# Patient Record
Sex: Male | Born: 1975 | Race: Black or African American | Hispanic: No | Marital: Married | Smoking: Current every day smoker
Health system: Southern US, Community
[De-identification: ages and names within clinical notes are randomized; demographics above are authoritative.]

## PROBLEM LIST (undated history)

## (undated) DIAGNOSIS — J189 Pneumonia, unspecified organism: Secondary | ICD-10-CM

## (undated) DIAGNOSIS — I10 Essential (primary) hypertension: Secondary | ICD-10-CM

---

## 2016-12-30 ENCOUNTER — Emergency Department: Payer: Self-pay

## 2016-12-30 ENCOUNTER — Emergency Department
Admission: EM | Admit: 2016-12-30 | Discharge: 2016-12-30 | Disposition: A | Discharge status: Home / Self Care | Payer: Self-pay | Attending: Emergency Medicine | Admitting: Emergency Medicine

## 2016-12-30 ENCOUNTER — Encounter: Payer: Self-pay | Admitting: *Deleted

## 2016-12-30 DIAGNOSIS — J189 Pneumonia, unspecified organism: Secondary | ICD-10-CM

## 2016-12-30 DIAGNOSIS — J181 Lobar pneumonia, unspecified organism: Secondary | ICD-10-CM | POA: Insufficient documentation

## 2016-12-30 DIAGNOSIS — F17200 Nicotine dependence, unspecified, uncomplicated: Secondary | ICD-10-CM

## 2016-12-30 DIAGNOSIS — R030 Elevated blood-pressure reading, without diagnosis of hypertension: Secondary | ICD-10-CM

## 2016-12-30 LAB — URINALYSIS, COMPLETE (UACMP) WITH MICROSCOPIC
BACTERIA UA: NONE SEEN
BILIRUBIN URINE: NEGATIVE
GLUCOSE, UA: NEGATIVE mg/dL
HGB URINE DIPSTICK: NEGATIVE
Ketones, ur: NEGATIVE mg/dL
LEUKOCYTES UA: NEGATIVE
Nitrite: NEGATIVE
PROTEIN: NEGATIVE mg/dL
Specific Gravity, Urine: 1.029 (ref 1.005–1.030)
pH: 5 (ref 5.0–8.0)

## 2016-12-30 MED ORDER — AZITHROMYCIN 250 MG PO TABS: ORAL_TABLET | ORAL | 0 refills | Status: DC

## 2016-12-30 MED ORDER — HYDROCODONE-ACETAMINOPHEN 5-325 MG PO TABS: 1.0000 | ORAL_TABLET | ORAL | 0 refills | Status: DC | PRN

## 2016-12-30 NOTE — ED Provider Notes (Signed)
[facility] Emergency Department Provider Note  ____________________________________________   First MD Initiated Contact with Patient 12/30/16 1158     (approximate)  I have reviewed the triage vital signs and the nursing notes.   HISTORY  Chief Complaint Fever and Cough    HPI Tyler Oliver is a 41 y.o. male is here with complaint of fever, body aches, cough and congestion for 6 days. Patient states that he had low-grade temp with some chills. He also complains of  low back pain. 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 He denies any injury to his back. He is unaware of any urinary symptoms or history of kidney stones. He rates his pain as 5/10.   History reviewed. No pertinent past medical history.  There are no active problems to display for this patient.   History reviewed. No pertinent surgical history.  Prior to Admission medications   Medication Sig Start Date End Date Taking? Authorizing Provider  azithromycin (ZITHROMAX Z-PAK) 250 MG tablet Take 2 tablets (500 mg) on  Day 1,  followed by 1 tablet (250 mg) once daily on Days 2 through 5. 12/30/16   Tommi Rumps, PA-C  HYDROcodone-acetaminophen (NORCO/VICODIN) 5-325 MG tablet Take 1 tablet by mouth every 4 (four) hours as needed for moderate pain. 12/30/16   Tommi Rumps, PA-C    Allergies Patient has no known allergies.  History reviewed. No pertinent family history.  Social History Social History  Substance Use Topics  . Smoking status: Never Smoker  . Smokeless tobacco: Never Used  . Alcohol use No    Review of Systems Constitutional: Subjective fever/chills Eyes: No visual changes. ENT: No sore throat. Positive nasal congestion. Cardiovascular: Denies chest pain. Respiratory: Denies shortness of breath. Positive productive cough. Gastrointestinal: No abdominal pain.  No nausea, no vomiting.     Genitourinary: Negative for dysuria. Musculoskeletal: Positive for back pain. Positive body aches. Skin: Negative for rash. Neurological: Negative for headaches, focal weakness or numbness.  10-point ROS otherwise negative.  ____________________________________________   PHYSICAL EXAM:  VITAL SIGNS: ED Triage Vitals  Enc Vitals Group     BP 12/30/16 1013 (!) 156/99     Pulse Rate 12/30/16 1013 83     Resp 12/30/16 1013 18     Temp 12/30/16 1013 99.8 F (37.7 C)     Temp Source 12/30/16 1013 Oral     SpO2 12/30/16 1013 98 %     Weight 12/30/16 1014 195 lb (88.5 kg)     Height 12/30/16 1014 6\' 1"  (1.854 m)     Head Circumference --      Peak Flow --      Pain Score 12/30/16 1013 5     Pain Loc --      Pain Edu? --      Excl. in GC? --     Constitutional: Alert and oriented. Well appearing and in no acute distress. Eyes: Conjunctivae are normal. PERRL. EOMI. Head: Atraumatic. Nose: Positive congestion/rhinnorhea. Mouth/Throat: Mucous membranes are moist.  Oropharynx non-erythematous. Posterior drainage noted. Neck: No stridor.   Hematological/Lymphatic/Immunilogical: No cervical lymphadenopathy. Cardiovascular: Normal rate, regular rhythm. Grossly normal heart sounds.  Good peripheral circulation. Respiratory: Normal respiratory effort.  No retractions. Lungs CTAB. No wheeze or rhonchi was noted. Patient does have a congested cough occasionally. Gastrointestinal: Soft and nontender. No distention.  Musculoskeletal: Moves upper and lower extremities without any difficulty.  Normal gait was noted. Neurologic:  Normal speech and language. No gross focal neurologic deficits are appreciated. No gait instability. Skin:  Skin is warm, dry and intact. No rash noted. Psychiatric: Mood and affect are normal. Speech and behavior are normal.  ____________________________________________   LABS (all labs ordered are listed, but only abnormal results are displayed)  Labs Reviewed   URINALYSIS, COMPLETE (UACMP) WITH MICROSCOPIC - Abnormal; Notable for the following:       Result Value   Color, Urine YELLOW (*)    APPearance CLEAR (*)    Squamous Epithelial / LPF 0-5 (*)    All other components within normal limits    RADIOLOGY  Chest x-ray per radiologist: IMPRESSION:  Peripheral right upper lobe opacity suspicious for Pneumonia in this  setting. No pleural effusion or other acute cardiopulmonary  abnormality.  I, Tommi Rumpshonda L Yliana Gravois, personally viewed and evaluated these images (plain radiographs) as part of my medical decision making, as well as reviewing the written report by the radiologist. ____________________________________________   PROCEDURES  Procedure(s) performed: None  Procedures  Critical Care performed: No  ____________________________________________   INITIAL IMPRESSION / ASSESSMENT AND PLAN / ED COURSE  Pertinent labs & imaging results that were available during my care of the patient were reviewed by me and considered in my medical decision making (see chart for details).  Patient was made aware that he does have pneumonia on his chest x-ray. Patient was given a prescription for Zithromax and Norco as needed for pain. Patient is to follow-up for a recheck of his pneumonia and repeat chest x-ray in 3  weeks as suggested by the radiologist. Patient was encouraged to discontinue smoking. He is given a prescription for Norco as needed for back pain and also to help decrease the amount of coughing. 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 He is given a list of clinics to follow up with his he does not have a PCP. His wife also is working on getting him an appointment with [facility]. He also [facility].  Patient also continued to have elevated blood pressure while in the department and will follow up with this on his appointment for his pneumonia.       ____________________________________________   FINAL CLINICAL IMPRESSION(S) / ED DIAGNOSES  Final diagnoses:  Community acquired pneumonia of right upper lobe of lung (HCC)  Current every day smoker  Elevated blood-pressure reading without diagnosis of hypertension      NEW MEDICATIONS STARTED DURING THIS VISIT:  Discharge Medication List as of 12/30/2016  1:10 PM    START taking these medications   Details  azithromycin (ZITHROMAX Z-PAK) 250 MG tablet Take 2 tablets (500 mg) on  Day 1,  followed by 1 tablet (250 mg) once daily on Days 2 through 5., Print    HYDROcodone-acetaminophen (NORCO/VICODIN) 5-325 MG tablet Take 1 tablet by mouth every 4 (four) hours as needed for moderate pain., Starting Tue 12/30/2016, Print         Note:  This document was prepared using Dragon voice recognition software and may include unintentional dictation errors.    Tommi Rumpshonda L Shawnee Gambone, PA-C 12/30/16 1500    Emily FilbertJonathan E Williams, MD 12/30/16 [phone]

## 2016-12-30 NOTE — Discharge Instructions (Signed)
Follow-up with one of the clinics listed above. You will need to be reevaluated in approximately 3 weeks for your pneumonia. Call to make an appointment. [facility] available as well as a possibility [facility]. You may also call [facility] to see if they're taking new patients. You will need to be followed up for your pneumonia and lower blood pressure will also need to be rechecked. Your blood pressure was elevated today in the emergency room and 153/99. Discontinue smoking. Begin taking Zithromax for the next 5 days. This medication stays in her body for 10 days. Norco as needed for pain and also this will decrease your coughing. Increase fluids. Return to the emergency room if any severe worsening of your symptoms.

## 2016-12-30 NOTE — ED Triage Notes (Signed)
States fever, body aches, cough and congestion since Wednesday

## 2016-12-30 NOTE — ED Notes (Signed)
Pt ambulatory to xray.

## 2016-12-30 NOTE — ED Notes (Signed)
See triage note  Low grade fever on arrival   Developed fever with body aches last weds

## 2017-01-13 ENCOUNTER — Encounter: Payer: Self-pay | Admitting: Emergency Medicine

## 2017-01-13 ENCOUNTER — Emergency Department
Admission: EM | Admit: 2017-01-13 | Discharge: 2017-01-13 | Disposition: A | Discharge status: Home / Self Care | Payer: Self-pay | Attending: Emergency Medicine | Admitting: Emergency Medicine

## 2017-01-13 DIAGNOSIS — X58XXXA Exposure to other specified factors, initial encounter: Secondary | ICD-10-CM | POA: Insufficient documentation

## 2017-01-13 DIAGNOSIS — S39012A Strain of muscle, fascia and tendon of lower back, initial encounter: Secondary | ICD-10-CM | POA: Insufficient documentation

## 2017-01-13 DIAGNOSIS — Y999 Unspecified external cause status: Secondary | ICD-10-CM | POA: Insufficient documentation

## 2017-01-13 DIAGNOSIS — Y939 Activity, unspecified: Secondary | ICD-10-CM | POA: Insufficient documentation

## 2017-01-13 DIAGNOSIS — Y929 Unspecified place or not applicable: Secondary | ICD-10-CM | POA: Insufficient documentation

## 2017-01-13 HISTORY — DX: Pneumonia, unspecified organism: J18.9

## 2017-01-13 LAB — URINALYSIS, COMPLETE (UACMP) WITH MICROSCOPIC
Bacteria, UA: NONE SEEN
Bilirubin Urine: NEGATIVE
Glucose, UA: NEGATIVE mg/dL
Hgb urine dipstick: NEGATIVE
Ketones, ur: NEGATIVE mg/dL
Leukocytes, UA: NEGATIVE
Nitrite: NEGATIVE
Protein, ur: NEGATIVE mg/dL
Specific Gravity, Urine: 1.008 (ref 1.005–1.030)
Squamous Epithelial / LPF: NONE SEEN
pH: 6 (ref 5.0–8.0)

## 2017-01-13 MED ORDER — HYDROCODONE-ACETAMINOPHEN 5-325 MG PO TABS
2.0000 | ORAL_TABLET | Freq: Once | ORAL | Status: AC
  Administered 2017-01-13: 2 via ORAL
  Filled 2017-01-13: qty 2

## 2017-01-13 MED ORDER — HYDROCODONE-ACETAMINOPHEN 5-325 MG PO TABS: 1.0000 | ORAL_TABLET | ORAL | 0 refills | Status: DC | PRN

## 2017-01-13 MED ORDER — NAPROXEN 500 MG PO TABS: 500.0000 mg | ORAL_TABLET | Freq: Two times a day (BID) | ORAL | 0 refills | Status: DC

## 2017-01-13 NOTE — ED Provider Notes (Signed)
[facility] Emergency Department Provider Note   ____________________________________________   First MD Initiated Contact with Patient 01/13/17 1132     (approximate)  I have reviewed the triage vital signs and the nursing notes.   HISTORY  Chief Complaint Back Pain    HPI Tyler Oliver is a 41 y.o. male is here with complaint of left lower back pain. Patient was seen in the emergency room 2 weeks ago and was diagnosed with pneumonia. He is feeling much better and the cough is improved. He states that during this time he developed left-sided back pain that increased with movement and with cough. Patient been taking over-the-counter medications without any relief of his pain. He denies any urinary symptoms or history of previous kidney stones. The patient was seen for his pneumonia it was suggested that he make an appointment with a primary care doctor to evaluate his blood pressure and treat if continued to be elevated. Patient has not established care with anyone. He denies any chest pain or shortness of breath. Patient rates his pain as 6 out of 10.   Past Medical History:  Diagnosis Date  . Pneumonia     There are no active problems to display for this patient.   History reviewed. No pertinent surgical history.  Prior to Admission medications   Not on File    Allergies Patient has no known allergies.  No family history on file.  Social History Social History  Substance Use Topics  . Smoking status: Never Smoker  . Smokeless tobacco: Never Used  . Alcohol use No    Review of Systems Constitutional: No fever/chills Eyes: No visual changes. ENT: No complaints Cardiovascular: Denies chest pain. Respiratory: Denies shortness of breath. Gastrointestinal: No abdominal pain.  No nausea, no vomiting.   Genitourinary: Negative for dysuria. Musculoskeletal: Positive left lower back pain. Skin: Negative for rash. Neurological: Negative  for headaches, focal weakness or numbness.  10-point ROS otherwise negative.  ____________________________________________   PHYSICAL EXAM:  VITAL SIGNS: ED Triage Vitals  Enc Vitals Group     BP 01/13/17 1018 (!) 160/109     Pulse Rate 01/13/17 1018 73     Resp 01/13/17 1018 20     Temp 01/13/17 1018 98.9 F (37.2 C)     Temp Source 01/13/17 1018 Oral     SpO2 01/13/17 1018 99 %     Weight 01/13/17 1018 205 lb (93 kg)     Height 01/13/17 1018  (1.854 m)     Head Circumference --      Peak Flow --      Pain Score 01/13/17 1022 0     Pain Loc --      Pain Edu? --      Excl. in GC? --     Constitutional: Alert and oriented. Well appearing and in no acute distress. Eyes: Conjunctivae are normal. PERRL. EOMI. Head: Atraumatic. Nose: No congestion/rhinnorhea. Neck: No stridor.   Cardiovascular: Normal rate, regular rhythm. Grossly normal heart sounds.  Good peripheral circulation. Respiratory: Normal respiratory effort.  No retractions. Lungs CTAB. Musculoskeletal: Examination of the back there is no gross deformity noted. There is no tenderness on palpation of the spinous processes from thorax to lumbosacral area. There is however tenderness on palpation of soft tissues left paravertebral muscles. No active muscle spasms were seen. Patient is able to move without restriction. Patient is able ambulate without assistance. Neurologic:  Normal speech and language. No gross focal neurologic  deficits are appreciated. No gait instability. Skin:  Skin is warm, dry and intact. No rash noted. Psychiatric: Mood and affect are normal. Speech and behavior are normal.  ____________________________________________   LABS (all labs ordered are listed, but only abnormal results are displayed)  Labs Reviewed  URINALYSIS, COMPLETE (UACMP) WITH MICROSCOPIC - Abnormal; Notable for the following:       Result Value   Color, Urine STRAW (*)    APPearance CLEAR (*)    All other components  within normal limits     PROCEDURES  Procedure(s) performed: None  Procedures  Critical Care performed: No  ____________________________________________   INITIAL IMPRESSION / ASSESSMENT AND PLAN / ED COURSE  Pertinent labs & imaging results that were available during my care of the patient were reviewed by me and considered in my medical decision making (see chart for details).  Patient was made aware that urinalysis did not indicate that he can extend was involved. While waiting for the results patient was given Norco 2 tablets and states that he is not having any pain at this time. Most likely this is a muscle strain related to his coughing from pneumonia. Patient will continue taking Norco one or 2 every 4 hours as needed for pain along with naproxen 500 mg twice a day with food. Today his blood pressure was also elevated. Patient again is encouraged to see a primary care doctor and wife would like for him to see Dr. Annabell Howells because she sees him. Patient again is made aware that he cannot drive or operate machinery while taking narcotics.    ____________________________________________   FINAL CLINICAL IMPRESSION(S) / ED DIAGNOSES  Final diagnoses:  Strain of lumbar region, initial encounter      NEW MEDICATIONS STARTED DURING THIS VISIT:  Discharge Medication List as of 01/13/2017 12:53 PM       Note:  This document was prepared using Dragon voice recognition software and may include unintentional dictation errors.    Tommi Rumps, PA-C 01/13/17 1556    Jene Every, MD 01/15/17 1256

## 2017-01-13 NOTE — ED Notes (Addendum)
See triage note. Having lower back pain   States was dx'd pneumonia   Felt better and cough has decreased but having lower back pain   Ambulates well   No limp   States pain is mainly on the right

## 2017-01-13 NOTE — Discharge Instructions (Signed)
Been taking Norco one or 2 every 4 hours as needed for moderate pain. Also naproxen 500 mg twice a day with food for pain and inflammation. Today you're blood pressure was also elevated and needs to be evaluated by someone who can monitor your blood pressure and treat for hypertension if needed. Consider [facility] acute care or Dr. Belva Crome office.  You may use moist heat or ice to her back as needed for comfort.

## 2017-01-13 NOTE — ED Triage Notes (Signed)
Pt pointed to left lower back when asked where back pain was. Appears in no distress at this time.

## 2017-01-13 NOTE — ED Triage Notes (Signed)
Had pneumonia 2 weeks ago.  Coughing is better, but has pain right side back and it is increased with movement.

## 2017-10-07 ENCOUNTER — Encounter: Payer: Self-pay | Admitting: Emergency Medicine

## 2017-10-07 ENCOUNTER — Emergency Department
Admission: EM | Admit: 2017-10-07 | Discharge: 2017-10-07 | Disposition: A | Discharge status: Home / Self Care | Payer: Self-pay | Attending: Emergency Medicine | Admitting: Emergency Medicine

## 2017-10-07 DIAGNOSIS — S39012A Strain of muscle, fascia and tendon of lower back, initial encounter: Secondary | ICD-10-CM | POA: Insufficient documentation

## 2017-10-07 DIAGNOSIS — Z79899 Other long term (current) drug therapy: Secondary | ICD-10-CM | POA: Insufficient documentation

## 2017-10-07 DIAGNOSIS — Y9389 Activity, other specified: Secondary | ICD-10-CM | POA: Insufficient documentation

## 2017-10-07 DIAGNOSIS — Y998 Other external cause status: Secondary | ICD-10-CM | POA: Insufficient documentation

## 2017-10-07 DIAGNOSIS — F17210 Nicotine dependence, cigarettes, uncomplicated: Secondary | ICD-10-CM | POA: Insufficient documentation

## 2017-10-07 DIAGNOSIS — I10 Essential (primary) hypertension: Secondary | ICD-10-CM | POA: Insufficient documentation

## 2017-10-07 DIAGNOSIS — Y92410 Unspecified street and highway as the place of occurrence of the external cause: Secondary | ICD-10-CM | POA: Insufficient documentation

## 2017-10-07 HISTORY — DX: Essential (primary) hypertension: I10

## 2017-10-07 MED ORDER — MELOXICAM 15 MG PO TABS: 15.0000 mg | ORAL_TABLET | Freq: Every day | ORAL | 0 refills | Status: DC

## 2017-10-07 MED ORDER — KETOROLAC TROMETHAMINE 30 MG/ML IJ SOLN
30.0000 mg | Freq: Once | INTRAMUSCULAR | Status: AC
  Administered 2017-10-07: 30 mg via INTRAMUSCULAR
  Filled 2017-10-07: qty 1

## 2017-10-07 MED ORDER — METHOCARBAMOL 500 MG PO TABS: 500.0000 mg | ORAL_TABLET | Freq: Four times a day (QID) | ORAL | 0 refills | Status: DC

## 2017-10-07 MED ORDER — METHOCARBAMOL 500 MG PO TABS
1000.0000 mg | ORAL_TABLET | Freq: Once | ORAL | Status: AC
Start: 2017-10-07 — End: 2017-10-07
  Administered 2017-10-07: 1000 mg via ORAL
  Filled 2017-10-07: qty 2
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
## 2017-11-10 IMAGING — CR DG CHEST 2V
1 series · 2 of 2 positions shown · non-contrast
Comparison: None.

CLINICAL DATA: 41-year-old male with fever body ache cough and
congestion for almost a week.

EXAM:
CHEST  2 VIEW

[Series 1: dg chest 2 view · 0.14mm/px · 2 of 2 slices shown]
[im 1/2]
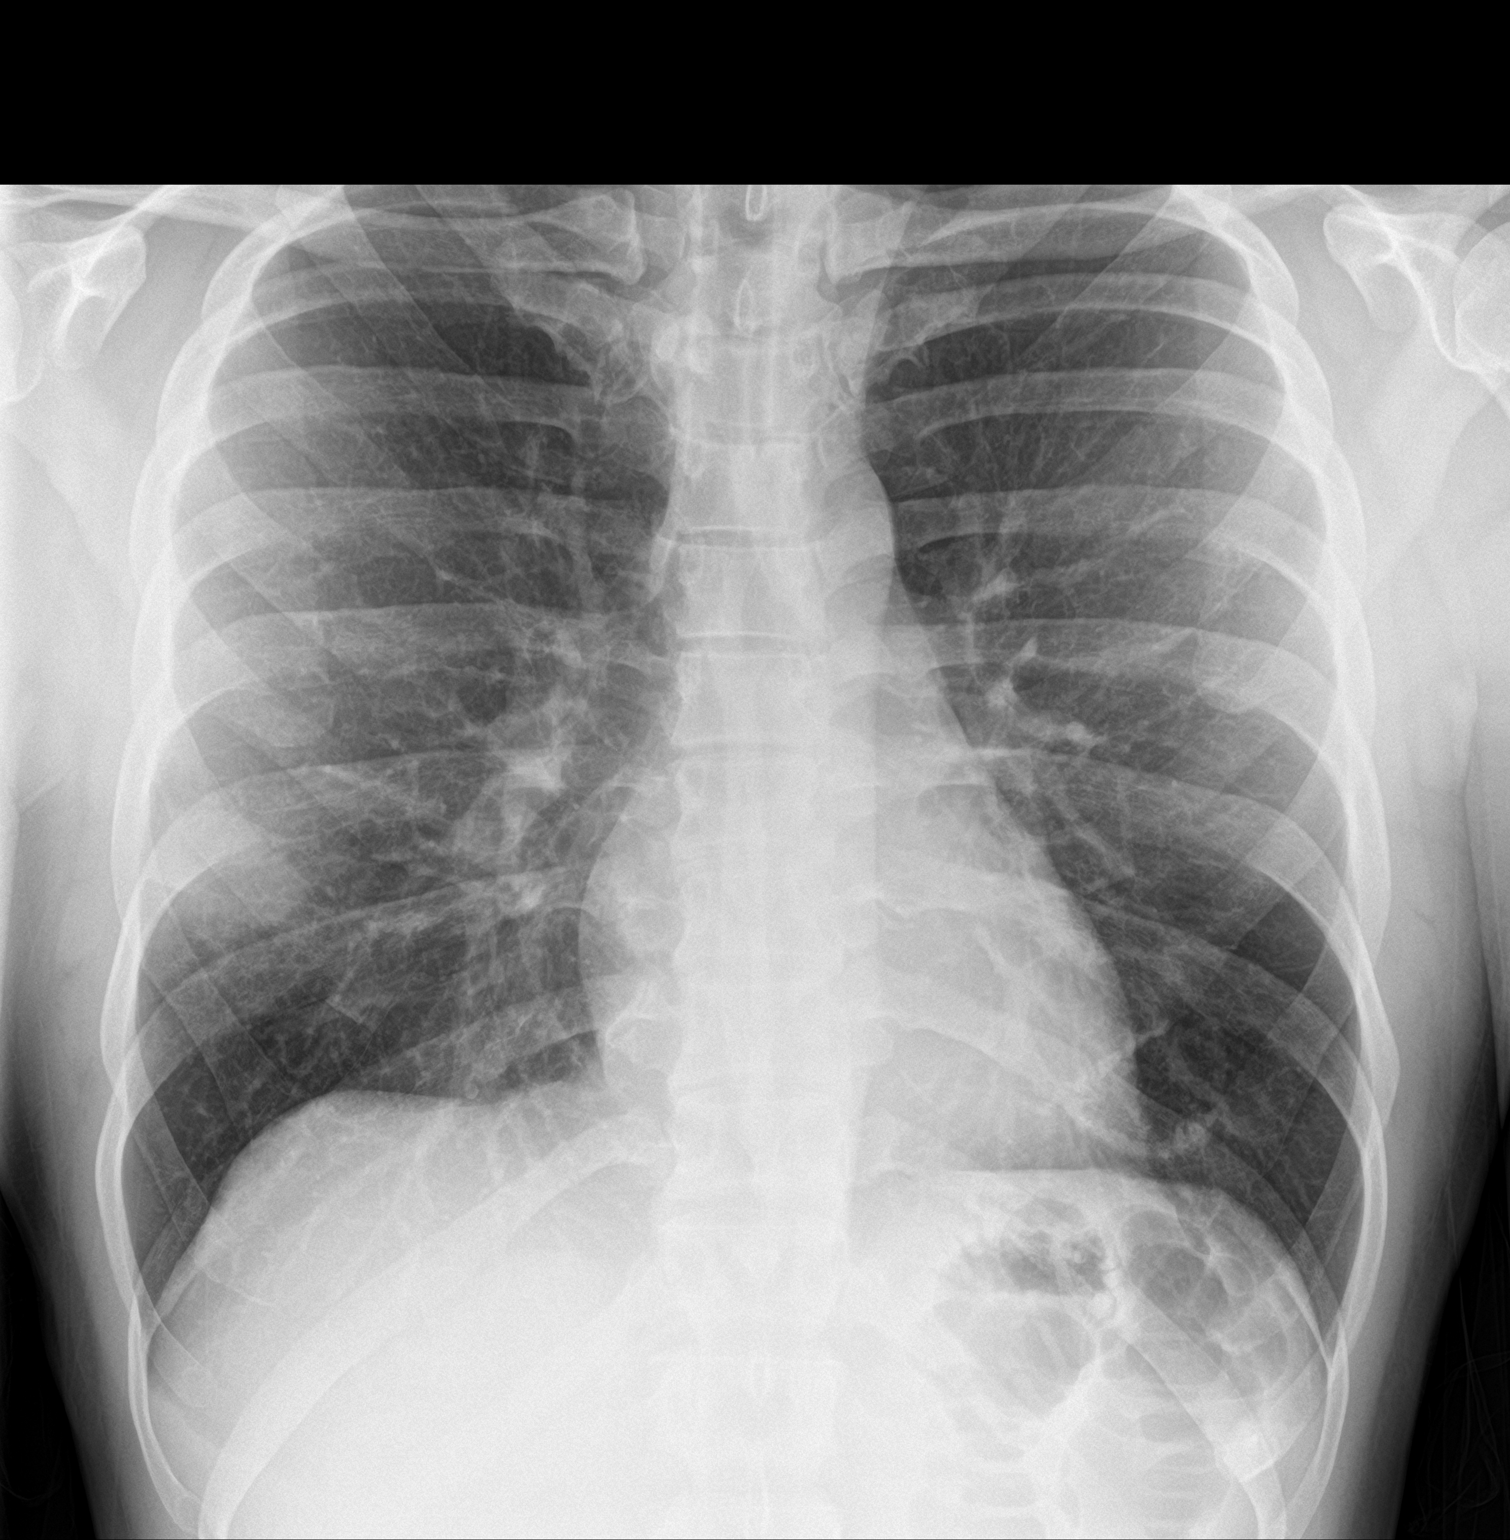
[im 2/2]
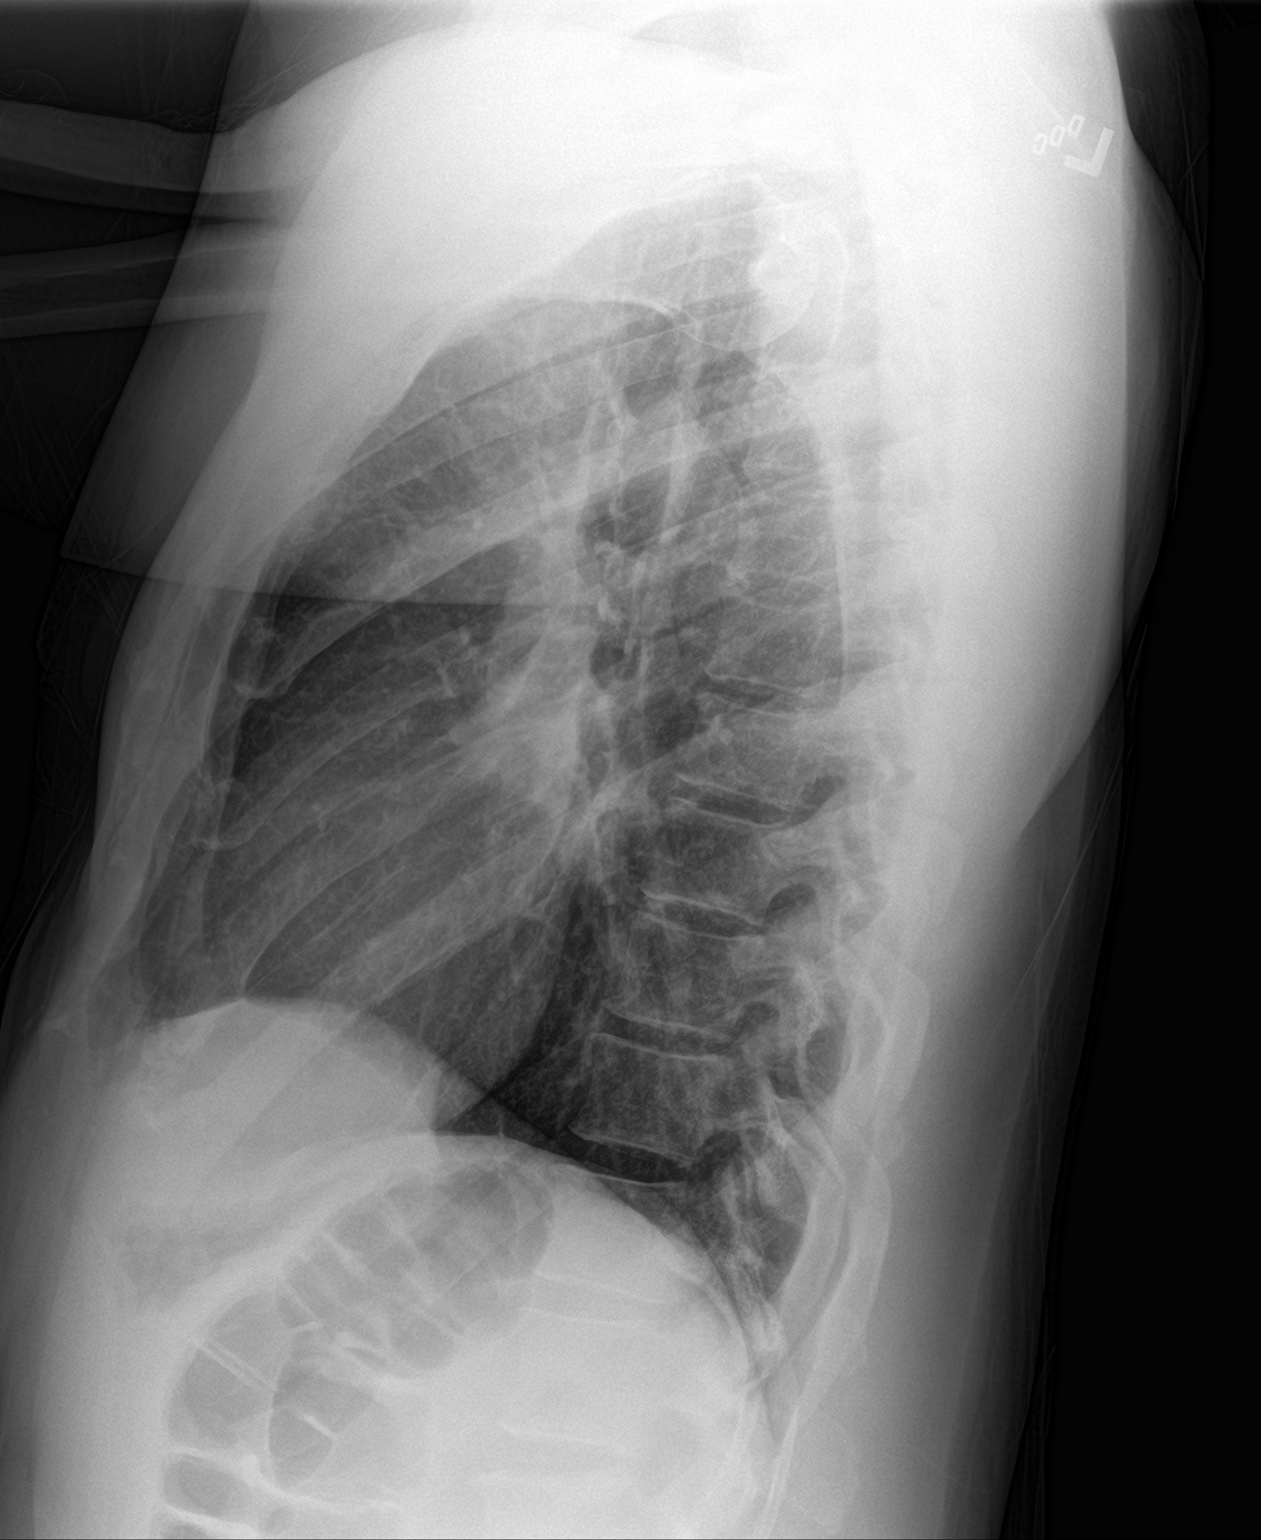

[2 of 2 positions shown; findings below may reference images not displayed]

FINDINGS: Normal lung volumes. Normal cardiac size and mediastinal contours.
Visualized tracheal air column is within normal limits. Asymmetric
confluent peripheral right lung opacity, probably abutting the minor
fissure, on the frontal view which is not correlated on the lateral.
No associated pleural effusion. Elsewhere lung markings appear
within normal limits. No other confluent pulmonary opacity. No acute
osseous abnormality identified. Negative visible bowel gas pattern.
IMPRESSION: Peripheral right upper lobe opacity suspicious for Pneumonia in this
setting. No pleural effusion or other acute cardiopulmonary
abnormality.

Followup PA and lateral chest X-ray is recommended in 3-4 weeks
following trial of antibiotic therapy to ensure resolution and
exclude underlying malignancy.

## 2018-08-05 ENCOUNTER — Encounter: Payer: Self-pay | Admitting: Emergency Medicine

## 2018-08-05 ENCOUNTER — Emergency Department
Admission: EM | Admit: 2018-08-05 | Discharge: 2018-08-05 | Disposition: A | Discharge status: Home / Self Care | Payer: Self-pay | Attending: Student in an Organized Health Care Education/Training Program | Admitting: Student in an Organized Health Care Education/Training Program

## 2018-08-05 ENCOUNTER — Other Ambulatory Visit: Payer: Self-pay

## 2018-08-05 DIAGNOSIS — I10 Essential (primary) hypertension: Secondary | ICD-10-CM | POA: Insufficient documentation

## 2018-08-05 DIAGNOSIS — Z79899 Other long term (current) drug therapy: Secondary | ICD-10-CM | POA: Insufficient documentation

## 2018-08-05 DIAGNOSIS — F17210 Nicotine dependence, cigarettes, uncomplicated: Secondary | ICD-10-CM | POA: Insufficient documentation

## 2018-08-05 DIAGNOSIS — M62830 Muscle spasm of back: Secondary | ICD-10-CM | POA: Insufficient documentation

## 2018-08-05 MED ORDER — CYCLOBENZAPRINE HCL 10 MG PO TABS: 10.0000 mg | ORAL_TABLET | Freq: Three times a day (TID) | ORAL | 0 refills | Status: DC | PRN

## 2018-08-05 MED ORDER — LIDOCAINE 5 % EX PTCH
1.0000 | MEDICATED_PATCH | CUTANEOUS | Status: DC
  Administered 2018-08-05: 1 via TRANSDERMAL
  Filled 2018-08-05: qty 1

## 2018-08-05 NOTE — ED Notes (Signed)
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

## 2018-08-05 NOTE — ED Triage Notes (Signed)
Patient to ER for c/o lower back pain. States he has h/o back problems, but fell this am (slipped). States now lower back pain is worse. 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

## 2018-08-05 NOTE — Discharge Instructions (Addendum)
Follow discharge care instructions and do not operate vehicles while taking muscle relaxants.

## 2018-08-05 NOTE — ED Provider Notes (Signed)
[facility] Emergency Department Provider Note   ____________________________________________   First MD Initiated Contact with Patient 08/05/18 [phone]     (approximate)  I have reviewed the triage vital signs and the nursing notes.   HISTORY  Chief Complaint Back Pain    HPI Arthuro Iam Lipson is a 42 y.o. male patient presents with low back pain secondary to a slip and near fall.  Patient state incident occurred this morning.  Patient the pain is worsened since the incident.  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  Patient denies any radicular component to his back pain.  Patient denies bladder bowel dysfunction.  Patient rates the pain as a 10/10.  Patient described the pain is "achy/sharp".  No palliative measures prior to arrival.   Past Medical History:  Diagnosis Date  . Hypertension   . Pneumonia     There are no active problems to display for this patient.   History reviewed. No pertinent surgical history.  Prior to Admission medications   Medication Sig Start Date End Date Taking? Authorizing Provider  cyclobenzaprine (FLEXERIL) 10 MG tablet Take 1 tablet (10 mg total) by mouth 3 (three) times daily as needed. 08/05/18   Joni Reining, PA-C  HYDROcodone-acetaminophen (NORCO/VICODIN) 5-325 MG tablet Take 1-2 tablets by mouth every 4 (four) hours as needed for moderate pain. 01/13/17   Tommi Rumps, PA-C  meloxicam (MOBIC) 15 MG tablet Take 1 tablet (15 mg total) by mouth daily. 10/07/17   Cuthriell, Delorise Royals, PA-C  methocarbamol (ROBAXIN) 500 MG tablet Take 1 tablet (500 mg total) by mouth 4 (four) times daily. 10/07/17   Cuthriell, Delorise Royals, PA-C  naproxen (NAPROSYN) 500 MG tablet Take 1 tablet (500 mg total) by mouth 2 (two) times daily with a meal. 01/13/17   Tommi Rumps, PA-C    Allergies Patient has no known allergies.  No family history on file.  Social History Social History    Tobacco Use  . Smoking status: Current Every Day Smoker    Packs/day: 1.50    Types: Cigarettes  . Smokeless tobacco: Never Used  Substance Use Topics  . Alcohol use: No  . Drug use: No    Review of Systems Constitutional: No fever/chills Eyes: No visual changes. ENT: No sore throat. Cardiovascular: Denies chest pain. Respiratory: Denies shortness of breath. Gastrointestinal: No abdominal pain.  No nausea, no vomiting.  No diarrhea.  No constipation. Genitourinary: Negative for dysuria. Musculoskeletal: Positive for back pain. Skin: Negative for rash. Neurological: Negative for headaches, focal weakness or numbness. Endocrine:Hypertension   ____________________________________________   PHYSICAL EXAM:  VITAL SIGNS: ED Triage Vitals  Enc Vitals Group     BP 08/05/18 0903 (!) 148/100     Pulse Rate 08/05/18 0903 83     Resp 08/05/18 0903 20     Temp 08/05/18 0903 98.3 F (36.8 C)     Temp Source 08/05/18 0903 Oral     SpO2 08/05/18 0903 96 %     Weight 08/05/18 0904 215 lb (97.5 kg)     Height 08/05/18 0904 6\' 1"  (1.854 m)     Head Circumference --      Peak Flow --      Pain Score 08/05/18 0904 8     Pain Loc --      Pain Edu? --      Excl. in [place]? --    Constitutional: Alert and oriented. Well appearing  and in no acute distress. Neck: No cervical spine tenderness to palpation. Hematological/Lymphatic/Immunilogical: No cervical lymphadenopathy. Cardiovascular: Normal rate, regular rhythm. Grossly normal heart sounds.  Good peripheral circulation. Respiratory: Normal respiratory effort.  No retractions. Lungs CTAB. Musculoskeletal: No obvious spinal deformity.  No guarding with palpation spinal processes.  Patient has left paraspinal muscle spasm with right lateral movements.   Neurologic:  Normal speech and language. No gross focal neurologic deficits are appreciated. No gait instability. Skin:  Skin is warm, dry and intact. No rash noted. Psychiatric: Mood  and affect are normal. Speech and behavior are normal.  ____________________________________________   LABS (all labs ordered are listed, but only abnormal results are displayed)  Labs Reviewed - No data to display ____________________________________________  EKG   ____________________________________________  RADIOLOGY  ED MD interpretation:    Official radiology report(s): No results found.  ____________________________________________   PROCEDURES  Procedure(s) performed: None  Procedures  Critical Care performed: No  ____________________________________________   INITIAL IMPRESSION / ASSESSMENT AND PLAN / ED COURSE  As part of my medical decision making, I reviewed the following data within the electronic MEDICAL RECORD NUMBER    Back pain second to muscle strain.  Patient given discharge care instruction.  Patient had a Lidoderm patch applied to the area of concern prior to departure.  Patient given a prescription for Flexeril.  Patient advised to not operate vehicles while taking Flexeril.  Patient advised [facility].      ____________________________________________   FINAL CLINICAL IMPRESSION(S) / ED DIAGNOSES  Final diagnoses:  Muscle spasm of back     ED Discharge Orders         Ordered    cyclobenzaprine (FLEXERIL) 10 MG tablet  3 times daily PRN     08/05/18 0916           Note:  This document was prepared using Dragon voice recognition software and may include unintentional dictation errors.    Joni Reining, PA-C 08/05/18 1610    Governor Rooks, MD 08/07/18 [phone]

## 2019-07-27 ENCOUNTER — Emergency Department
Admission: EM | Admit: 2019-07-27 | Discharge: 2019-07-27 | Disposition: A | Discharge status: Home / Self Care | Payer: Self-pay | Attending: Emergency Medicine | Admitting: Emergency Medicine

## 2019-07-27 ENCOUNTER — Other Ambulatory Visit: Payer: Self-pay

## 2019-07-27 DIAGNOSIS — M545 Low back pain, unspecified: Secondary | ICD-10-CM

## 2019-07-27 DIAGNOSIS — I10 Essential (primary) hypertension: Secondary | ICD-10-CM | POA: Insufficient documentation

## 2019-07-27 DIAGNOSIS — F17210 Nicotine dependence, cigarettes, uncomplicated: Secondary | ICD-10-CM | POA: Insufficient documentation

## 2019-07-27 MED ORDER — KETOROLAC TROMETHAMINE 30 MG/ML IJ SOLN
30.0000 mg | Freq: Once | INTRAMUSCULAR | Status: AC
  Administered 2019-07-27: 09:00:00 30 mg via INTRAMUSCULAR
  Filled 2019-07-27: qty 1

## 2019-07-27 MED ORDER — METHOCARBAMOL 500 MG PO TABS: 500.0000 mg | ORAL_TABLET | Freq: Four times a day (QID) | ORAL | 0 refills | Status: DC | PRN

## 2019-07-27 MED ORDER — NAPROXEN 500 MG PO TABS: 500.0000 mg | ORAL_TABLET | Freq: Two times a day (BID) | ORAL | 0 refills | Status: DC

## 2019-07-27 NOTE — Discharge Instructions (Addendum)
Follow-up with your primary care provider or [facility] acute [facility] if any continued problems.  You may use ice or heat to your back as needed for discomfort.  They methocarbamol is a muscle relaxant and should not be taken while driving or operating machinery.  Naproxen is 1 tablet twice a day with food.  This medication should be taken every day and there are no drowsiness side effects to this medication.  If more than 2 days is needed to be out of work you will need to see your primary care provider.

## 2019-07-27 NOTE — ED Provider Notes (Signed)
[facility] Emergency Department Provider Note  ____________________________________________   First MD Initiated Contact with Patient 07/27/19 0830     (approximate)  I have reviewed the triage vital signs and the nursing notes.   HISTORY  Chief Complaint Back Pain   HPI Tyler Oliver is a 43 y.o. male presents to the ED with complaint of left lower back pain that started yesterday without history of injury.  Patient states that he has had this problem before.  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  He has been taking multiple prescribed medication at home for which he does not not remember the names of any of these and states that he ran out yesterday.  He denies any urinary symptoms or history of kidney stones.  He denies any paresthesias, saddle anesthesias or incontinence of bowel or bladder.  He states this is similar to his previous low back pain.  He needs a note to stay out of work for a couple days until his back is better.  He rates his pain as 7 out of 10.     Past Medical History:  Diagnosis Date  . Hypertension   . Pneumonia     There are no active problems to display for this patient.   History reviewed. No pertinent surgical history.  Prior to Admission medications   Medication Sig Start Date End Date Taking? Authorizing Provider  methocarbamol (ROBAXIN) 500 MG tablet Take 1 tablet (500 mg total) by mouth every 6 (six) hours as needed. 07/27/19   Johnn Hai, PA-C  naproxen (NAPROSYN) 500 MG tablet Take 1 tablet (500 mg total) by mouth 2 (two) times daily with a meal. 07/27/19   Johnn Hai, PA-C    Allergies Patient has no known allergies.  No family history on file.  Social History Social History   Tobacco Use  . Smoking status: Current Every Day Smoker    Packs/day: 1.50    Types: Cigarettes  . Smokeless tobacco: Never Used  Substance Use Topics  . Alcohol use: No  . Drug  use: No    Review of Systems Constitutional: No fever/chills Cardiovascular: Denies chest pain. Respiratory: Denies shortness of breath. Gastrointestinal: No abdominal pain.  No nausea, no vomiting.   Genitourinary: Negative for dysuria.  Negative for hematuria. Musculoskeletal: Positive for low back pain. Skin: Negative for rash. Neurological: Negative for headaches, focal weakness or numbness. ____________________________________________   PHYSICAL EXAM:  VITAL SIGNS: ED Triage Vitals  Enc Vitals Group     BP 07/27/19 0827 (!) 140/94     Pulse --      Resp 07/27/19 0827 16     Temp 07/27/19 0827 97.7 F (36.5 C)     Temp Source 07/27/19 0827 Oral     SpO2 07/27/19 0827 97 %     Weight 07/27/19 0826 225 lb (102.1 kg)     Height 07/27/19 0826 6\' 1"  (1.854 m)     Head Circumference --      Peak Flow --      Pain Score 07/27/19 0825 7     Pain Loc --      Pain Edu? --      Excl. in [place]? --     Constitutional: Alert and oriented. Well appearing and in no acute distress. Eyes: Conjunctivae are normal.  Head: Atraumatic. Neck: No stridor.   Cardiovascular: Normal rate, regular rhythm. Grossly normal heart sounds.  Good peripheral  circulation. Respiratory: Normal respiratory effort.  No retractions. Lungs CTAB. Gastrointestinal: Soft and nontender. No distention. No abdominal bruits. No CVA tenderness. Musculoskeletal: On examination of the lower back there is no gross deformity no point tenderness on palpation of the lumbar spine.  There is however tenderness on palpation of the left paravertebral muscles.  Range of motion is slightly restricted secondary to discomfort.  Straight leg raises are negative on the right and left straight leg raise is mildly positive at approximately 45 degrees.good muscle strength bilaterally.  Patient is able to ambulate without any assistance. Neurologic:  Normal speech and language. No gross focal neurologic deficits are appreciated.  Reflexes  are 2+ bilaterally.  No gait instability. Skin:  Skin is warm, dry and intact. No rash noted. Psychiatric: Mood and affect are normal. Speech and behavior are normal.  ____________________________________________   LABS (all labs ordered are listed, but only abnormal results are displayed)  Labs Reviewed - No data to display  PROCEDURES  Procedure(s) performed (including Critical Care):  Procedures   ____________________________________________   INITIAL IMPRESSION / ASSESSMENT AND PLAN / ED COURSE  As part of my medical decision making, I reviewed the following data within the electronic MEDICAL RECORD NUMBER Notes from prior ED visits and [place] Controlled Substance Database  43 year old male presents to the ED with complaint of low back pain that started yesterday without history of injury.  Patient states that he has had problems with his back in the past and is taken several prescribed medications for which he cannot name or did not bring to the ED.  He states these medications were "old" and did not help with his pain.  Neurologically he is intact and straight leg raises of the left leg was mildly positive at 45 degrees.  Patient is ambulatory without any assistance.  Range of motion is minimally restricted however muscles in the lower lumbar paravertebral area to the left are moderately tender to palpation.  Patient was given Toradol 30 mg IM while in the ED.  He was given a prescription for naproxen 500 mg twice daily and a prescription for methocarbamol 500 mg every 6 hours as needed for muscle spasms.  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  He is to follow-up with his PCP or Dr. Ernest Pine if any continued problems with his back.  ____________________________________________   FINAL CLINICAL IMPRESSION(S) / ED DIAGNOSES  Final diagnoses:  Acute left-sided low back  pain without sciatica     ED Discharge Orders         Ordered    naproxen (NAPROSYN) 500 MG tablet  2 times daily with meals     07/27/19 0848    methocarbamol (ROBAXIN) 500 MG tablet  Every 6 hours PRN     07/27/19 0848           Note:  This document was prepared using Dragon voice recognition software and may include unintentional dictation errors.    Tommi Rumps, PA-C 07/27/19 1144    Sharman Cheek, MD 07/27/19 [phone]

## 2019-07-27 NOTE — ED Triage Notes (Signed)
Pt c/o lower back pain that started yesterday, denies injury.

## 2019-12-01 ENCOUNTER — Telehealth: Payer: Self-pay | Admitting: *Deleted

## 2019-12-01 ENCOUNTER — Other Ambulatory Visit: Payer: Self-pay

## 2019-12-01 ENCOUNTER — Encounter: Payer: Self-pay | Admitting: Emergency Medicine

## 2019-12-01 ENCOUNTER — Emergency Department
Admission: EM | Admit: 2019-12-01 | Discharge: 2019-12-01 | Disposition: A | Discharge status: Home / Self Care | Payer: HRSA Program | Attending: Emergency Medicine | Admitting: Emergency Medicine

## 2019-12-01 DIAGNOSIS — U071 COVID-19: Secondary | ICD-10-CM | POA: Insufficient documentation

## 2019-12-01 DIAGNOSIS — R0981 Nasal congestion: Secondary | ICD-10-CM | POA: Diagnosis present

## 2019-12-01 DIAGNOSIS — F17210 Nicotine dependence, cigarettes, uncomplicated: Secondary | ICD-10-CM | POA: Insufficient documentation

## 2019-12-01 DIAGNOSIS — Z20822 Contact with and (suspected) exposure to covid-19: Secondary | ICD-10-CM | POA: Insufficient documentation

## 2019-12-01 DIAGNOSIS — I10 Essential (primary) hypertension: Secondary | ICD-10-CM | POA: Insufficient documentation

## 2019-12-01 DIAGNOSIS — Z79899 Other long term (current) drug therapy: Secondary | ICD-10-CM | POA: Diagnosis not present

## 2019-12-01 DIAGNOSIS — B349 Viral infection, unspecified: Secondary | ICD-10-CM

## 2019-12-01 LAB — SARS CORONAVIRUS 2 (TAT 6-24 HRS): SARS Coronavirus 2: POSITIVE — AB

## 2019-12-01 MED ORDER — KETOROLAC TROMETHAMINE 10 MG PO TABS: 10.0000 mg | ORAL_TABLET | Freq: Four times a day (QID) | ORAL | 0 refills | Status: DC | PRN

## 2019-12-01 MED ORDER — CYCLOBENZAPRINE HCL 10 MG PO TABS: 10.0000 mg | ORAL_TABLET | Freq: Three times a day (TID) | ORAL | 0 refills | Status: DC | PRN

## 2019-12-01 MED ORDER — FEXOFENADINE-PSEUDOEPHED ER 60-120 MG PO TB12: 1.0000 | ORAL_TABLET | Freq: Two times a day (BID) | ORAL | 0 refills | Status: DC

## 2019-12-01 MED ORDER — KETOROLAC TROMETHAMINE 60 MG/2ML IM SOLN
60.0000 mg | Freq: Once | INTRAMUSCULAR | Status: AC
  Administered 2019-12-01: 10:00:00 60 mg via INTRAMUSCULAR
  Filled 2019-12-01: qty 2

## 2019-12-01 NOTE — ED Provider Notes (Signed)
[facility] Emergency Department Provider Note   ____________________________________________   First MD Initiated Contact with Patient 12/01/19 [phone]     (approximate)  I have reviewed the triage vital signs and the nursing notes.   HISTORY  Chief Complaint Nasal Congestion, Generalized Body Aches, and Fatigue    HPI Tyler Oliver is a 44 y.o. male patient complain of fatigue, body aches, and nasal congestion.  Patient denies sore throat, nausea, vomiting, diarrhea.  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  Patient state no known contact with COVID-19.  Patient rates his pain discomfort as 5/10.  Patient described the pain is "achy".  No palliative measure for complaint.         Past Medical History:  Diagnosis Date  . Hypertension   . Pneumonia     There are no problems to display for this patient.   History reviewed. No pertinent surgical history.  Prior to Admission medications   Medication Sig Start Date End Date Taking? Authorizing Provider  cyclobenzaprine (FLEXERIL) 10 MG tablet Take 1 tablet (10 mg total) by mouth 3 (three) times daily as needed. 12/01/19   Sable Feil, PA-C  fexofenadine-pseudoephedrine (ALLEGRA-D) 60-120 MG 12 hr tablet Take 1 tablet by mouth 2 (two) times daily. 12/01/19   Sable Feil, PA-C  ketorolac (TORADOL) 10 MG tablet Take 1 tablet (10 mg total) by mouth every 6 (six) hours as needed. 12/01/19   Sable Feil, PA-C  methocarbamol (ROBAXIN) 500 MG tablet Take 1 tablet (500 mg total) by mouth every 6 (six) hours as needed. 07/27/19   Johnn Hai, PA-C  naproxen (NAPROSYN) 500 MG tablet Take 1 tablet (500 mg total) by mouth 2 (two) times daily with a meal. 07/27/19   Johnn Hai, PA-C    Allergies Patient has no known allergies.  No family history on file.  Social History Social History   Tobacco Use  . Smoking status: Current Every  Day Smoker    Packs/day: 1.50    Types: Cigarettes  . Smokeless tobacco: Never Used  Substance Use Topics  . Alcohol use: No  . Drug use: No    Review of Systems Constitutional: Fever/chills, fatigue, and body aches. Eyes: No visual changes. ENT: No sore throat.  Nasal congestion. Cardiovascular: Denies chest pain. Respiratory: Denies shortness of breath. Gastrointestinal: No abdominal pain.  No nausea, no vomiting.  No diarrhea.  No constipation. Genitourinary: Negative for dysuria. Musculoskeletal: Negative for back pain. Skin: Negative for rash. Neurological: Negative for headaches, focal weakness or numbness. Endocrine:  Hypertension ____________________________________________   PHYSICAL EXAM:  VITAL SIGNS: ED Triage Vitals  Enc Vitals Group     BP 12/01/19 0845 (!) 140/104     Pulse Rate 12/01/19 0845 89     Resp 12/01/19 0845 16     Temp 12/01/19 0845 98.7 F (37.1 C)     Temp Source 12/01/19 0845 Oral     SpO2 12/01/19 0845 94 %     Weight 12/01/19 0842 220 lb (99.8 kg)     Height 12/01/19 0842 6\' 1"  (1.854 m)     Head Circumference --      Peak Flow --      Pain Score 12/01/19 0841 5     Pain Loc --      Pain Edu? --      Excl. in [place]? --    Constitutional: Alert and oriented. Well  appearing and in no acute distress. Nose: Edematous nasal turbinates clear rhinorrhea. Mouth/Throat: Mucous membranes are moist.  Oropharynx non-erythematous. Neck: No stridor.   Hematological/Lymphatic/Immunilogical: No cervical lymphadenopathy. Cardiovascular: Normal rate, regular rhythm. Grossly normal heart sounds.  Good peripheral circulation.  Elevated blood pressure Respiratory: Normal respiratory effort.  No retractions. Lungs CTAB. Gastrointestinal: Soft and nontender. No distention. No abdominal bruits. No CVA tenderness. Skin:  Skin is warm, dry and intact. No rash noted. Psychiatric: Mood and affect are normal. Speech and behavior are  normal.  ____________________________________________   LABS (all labs ordered are listed, but only abnormal results are displayed)  Labs Reviewed  SARS CORONAVIRUS 2 (TAT 6-24 HRS)   ____________________________________________  EKG   ____________________________________________  RADIOLOGY  ED MD interpretation:    Official radiology report(s): No results found.  ____________________________________________   PROCEDURES  Procedure(s) performed (including Critical Care):  Procedures   ____________________________________________   INITIAL IMPRESSION / ASSESSMENT AND PLAN / ED COURSE  As part of my medical decision making, I reviewed the following data within the electronic MEDICAL RECORD NUMBER     Patient presents with 2 days of fatigue, fevers, chills, and body aches.  Physical exam is consistent with viral illness.  Patient given discharge care instructions and advised to self quarantine pending results of COVID-19 test.    Tyler Oliver was evaluated in Emergency Department on 12/01/2019 for the symptoms described in the history of present illness. He was evaluated in the context of the global COVID-19 pandemic, which necessitated consideration that the patient might be at risk for infection with the SARS-CoV-2 virus that causes COVID-19. Institutional protocols and algorithms that pertain to the evaluation of patients at risk for COVID-19 are in a state of rapid change based on information released by regulatory bodies including the CDC and federal and state organizations. These policies and algorithms were followed during the patient's care in the ED.       ____________________________________________   FINAL CLINICAL IMPRESSION(S) / ED DIAGNOSES  Final diagnoses:  Viral illness     ED Discharge Orders         Ordered    ketorolac (TORADOL) 10 MG tablet  Every 6 hours PRN     12/01/19 0909    cyclobenzaprine (FLEXERIL) 10 MG tablet  3 times daily  PRN     12/01/19 0909    fexofenadine-pseudoephedrine (ALLEGRA-D) 60-120 MG 12 hr tablet  2 times daily     12/01/19 0909           Note:  This document was prepared using Dragon voice recognition software and may include unintentional dictation errors.    Joni Reining, PA-C 12/01/19 9024    Sharman Cheek, MD 12/01/19 1536

## 2019-12-01 NOTE — Discharge Instructions (Signed)
Follow discharge care instruction take medication as directed.  Advised self quarantine pending results of COVID-19 test.  If test is positive must quarantine for additional 10 days. °

## 2019-12-01 NOTE — Telephone Encounter (Signed)
Calling for Covid 19 test results. Is pending at this time.

## 2019-12-01 NOTE — ED Triage Notes (Signed)
Says he has cold symptoms and some aching and feels tired.  Says not coughing or sneezine.

## 2019-12-01 NOTE — ED Notes (Signed)
Discharge instructions and prescriptions reviewed with patient. Pt verbalized understanding.  

## 2019-12-02 ENCOUNTER — Telehealth: Payer: Self-pay

## 2019-12-02 NOTE — Telephone Encounter (Signed)
Patient notified of + COVID result. Patient test due to symptoms: congestion, low grade fever,body aches. Patient advised to treat symptoms as needed OTC, contact PCP for follow up and go to ED for trouble breathing ,dehydration or severe weakness. Advised to isolate and safe precautions in the home reviewed. CDC criteria for ending isolation given. Health dept notified.

## 2019-12-02 NOTE — Telephone Encounter (Signed)
Pt called to get covid test results from 2/5. Will have nurse call him back.   Joycelyn Rua Hopkins

## 2023-07-09 ENCOUNTER — Emergency Department
Admission: EM | Admit: 2023-07-09 | Discharge: 2023-07-09 | Disposition: A | Discharge status: Home / Self Care | Payer: Self-pay | Attending: Emergency Medicine | Admitting: Emergency Medicine

## 2023-07-09 ENCOUNTER — Other Ambulatory Visit: Payer: Self-pay

## 2023-07-09 ENCOUNTER — Encounter: Payer: Self-pay | Admitting: Emergency Medicine

## 2023-07-09 DIAGNOSIS — R300 Dysuria: Secondary | ICD-10-CM | POA: Insufficient documentation

## 2023-07-09 LAB — URINALYSIS, ROUTINE W REFLEX MICROSCOPIC
Bacteria, UA: NONE SEEN
Bilirubin Urine: NEGATIVE
Glucose, UA: NEGATIVE mg/dL
Ketones, ur: NEGATIVE mg/dL
Leukocytes,Ua: NEGATIVE
Nitrite: NEGATIVE
Protein, ur: NEGATIVE mg/dL
Specific Gravity, Urine: 1.015 (ref 1.005–1.030)
pH: 6 (ref 5.0–8.0)

## 2023-07-09 LAB — WET PREP, GENITAL
Clue Cells Wet Prep HPF POC: NONE SEEN
Sperm: NONE SEEN
Trich, Wet Prep: NONE SEEN
WBC, Wet Prep HPF POC: <10 (ref <10)
Yeast Wet Prep HPF POC: NONE SEEN

## 2023-07-09 LAB — CHLAMYDIA/NGC RT PCR (ARMC ONLY)
Chlamydia Tr: NOT DETECTED
N gonorrhoeae: NOT DETECTED

## 2023-07-09 NOTE — ED Provider Notes (Signed)
[facility] Provider Note    Event Date/Time   First MD Initiated Contact with Patient 07/09/23 [phone]     (approximate)   History   Dysuria   HPI  Tyler Oliver is a 47 y.o. male who presents for evaluation of burning at the end of his stream for the past 1 to 2 months.  He reports that it is not every day, but is many days.  He denies any burning in the middle of his stream.  No abdominal pain or back pain.  No hematuria.  No discharge.  He reports that he is sexually active with his wife only.  There are no problems to display for this patient.         Physical Exam   Triage Vital Signs: ED Triage Vitals  Encounter Vitals Group     BP 07/09/23 0830 (!) 159/116     Systolic BP Percentile --      Diastolic BP Percentile --      Pulse Rate 07/09/23 0830 83     Resp 07/09/23 0830 18     Temp 07/09/23 0830 98.3 F (36.8 C)     Temp Source 07/09/23 0830 Oral     SpO2 07/09/23 0830 96 %     Weight 07/09/23 0833 225 lb (102.1 kg)     Height 07/09/23 0833 6\' 1"  (1.854 m)     Head Circumference --      Peak Flow --      Pain Score 07/09/23 0833 0     Pain Loc --      Pain Education --      Exclude from Growth Chart --     Most recent vital signs: Vitals:   07/09/23 0830  BP: (!) 159/116  Pulse: 83  Resp: 18  Temp: 98.3 F (36.8 C)  SpO2: 96%    Physical Exam Vitals and nursing note reviewed.  Constitutional:      General: Awake and alert. No acute distress.    Appearance: Normal appearance. The patient is normal weight.  HENT:     Head: Normocephalic and atraumatic.     Mouth: Mucous membranes are moist.  Eyes:     General: PERRL. Normal EOMs        Right eye: No discharge.        Left eye: No discharge.     Conjunctiva/sclera: Conjunctivae normal.  Cardiovascular:     Rate and Rhythm: Normal rate and regular rhythm.     Pulses: Normal pulses.  Pulmonary:     Effort: Pulmonary effort is normal. No respiratory distress.      Breath sounds: Normal breath sounds.  Abdominal:     Abdomen is soft. There is no abdominal tenderness. No rebound or guarding. No distention. Musculoskeletal:        General: No swelling. Normal range of motion.     Cervical back: Normal range of motion and neck supple.  Skin:    General: Skin is warm and dry.     Capillary Refill: Capillary refill takes less than 2 seconds.     Findings: No rash.  Neurological:     Mental Status: The patient is awake and alert.      ED Results / Procedures / Treatments   Labs (all labs ordered are listed, but only abnormal results are displayed) Labs Reviewed  URINALYSIS, ROUTINE W REFLEX MICROSCOPIC - Abnormal; Notable for the following components:      Result Value  Color, Urine YELLOW (*)    APPearance CLEAR (*)    Hgb urine dipstick SMALL (*)    All other components within normal limits  CHLAMYDIA/NGC RT PCR ([facility] ONLY)            WET PREP, GENITAL  URINE CULTURE     EKG     RADIOLOGY     PROCEDURES:  Critical Care performed:   Procedures   MEDICATIONS ORDERED IN ED: Medications - No data to display   IMPRESSION / MDM / ASSESSMENT AND PLAN / ED COURSE  I reviewed the triage vital signs and the nursing notes.   Differential diagnosis includes, but is not limited to, UTI, urethritis, Candida, trichomonas, STD.  Patient is awake and alert, hemodynamically stable and afebrile.  He is nontoxic in appearance.  He deferred GU exam.  Urinalysis is unremarkable, GC chlamydia is negative, wet prep is negative.  Recommended close outpatient follow-up with urology.  The appropriate follow-up information was provided.  Urine culture was sent and he was advised that he will be notified if this is positive.  He has no abdominal pain or flank pain currently, no tenderness on exam.  We discussed return precautions and the importance.  Patient follow-up.  Patient presents agrees with plan.  He was discharged in stable  condition.   Patient's presentation is most consistent with acute complicated illness / injury requiring diagnostic workup.    FINAL CLINICAL IMPRESSION(S) / ED DIAGNOSES   Final diagnoses:  Dysuria     Rx / DC Orders   ED Discharge Orders     None        Note:  This document was prepared using Dragon voice recognition software and may include unintentional dictation errors.   Keturah Shavers 07/09/23 1357    Sharman Cheek, MD 07/09/23 1438

## 2023-07-09 NOTE — Discharge Instructions (Signed)
Please follow-up with urology.  We will notify you if your results and the other urine culture is positive.  Please return for any new, worsening, or change in symptoms or other concerns.  It was a pleasure caring for you today.

## 2023-07-09 NOTE — ED Notes (Signed)
Pt. Self-swabbed for wet prep.

## 2023-07-09 NOTE — ED Triage Notes (Signed)
C/O burning and tingling with urination.  States at time urgency.  STates symptoms have been ongoing x 1 month. Denies penile discharge.  AAOx3.  Skin warm and dry. NAD

## 2023-07-10 LAB — URINE CULTURE: Culture: NO GROWTH

## 2023-08-05 ENCOUNTER — Ambulatory Visit: Payer: Self-pay | Admitting: Urology

## 2023-08-12 ENCOUNTER — Encounter: Payer: Self-pay | Admitting: Urology

## 2024-06-29 ENCOUNTER — Other Ambulatory Visit: Payer: Self-pay

## 2024-06-29 ENCOUNTER — Encounter: Payer: Self-pay | Admitting: Emergency Medicine

## 2024-06-29 ENCOUNTER — Emergency Department: Payer: Self-pay

## 2024-06-29 ENCOUNTER — Emergency Department
Admission: EM | Admit: 2024-06-29 | Discharge: 2024-06-29 | Disposition: A | Discharge status: Home / Self Care | Payer: Self-pay | Attending: Emergency Medicine | Admitting: Emergency Medicine

## 2024-06-29 DIAGNOSIS — I10 Essential (primary) hypertension: Secondary | ICD-10-CM | POA: Insufficient documentation

## 2024-06-29 DIAGNOSIS — R42 Dizziness and giddiness: Secondary | ICD-10-CM

## 2024-06-29 LAB — BASIC METABOLIC PANEL WITH GFR
Anion gap: 9 (ref 5–15)
BUN: 14 mg/dL (ref 6–20)
CO2: 23 mmol/L (ref 22–32)
Calcium: 8.9 mg/dL (ref 8.9–10.3)
Chloride: 105 mmol/L (ref 98–111)
Creatinine, Ser: 1.05 mg/dL (ref 0.61–1.24)
GFR, Estimated: >60 mL/min (ref >60)
Glucose, Bld: 129 mg/dL — ABNORMAL HIGH (ref 70–99)
Potassium: 3.6 mmol/L (ref 3.5–5.1)
Sodium: 137 mmol/L (ref 135–145)

## 2024-06-29 LAB — CBC
HCT: 50.9 % (ref 39.0–52.0)
Hemoglobin: 17.2 g/dL — ABNORMAL HIGH (ref 13.0–17.0)
MCH: 29.6 pg (ref 26.0–34.0)
MCHC: 33.8 g/dL (ref 30.0–36.0)
MCV: 87.6 fL (ref 80.0–100.0)
Platelets: 319 K/uL (ref 150–400)
RBC: 5.81 MIL/uL (ref 4.22–5.81)
RDW: 13.6 % (ref 11.5–15.5)
WBC: 7.9 K/uL (ref 4.0–10.5)
nRBC: 0 % (ref 0.0–0.2)

## 2024-06-29 LAB — TROPONIN I (HIGH SENSITIVITY): Troponin I (High Sensitivity): 4 ng/L (ref <18)

## 2024-06-29 MED ORDER — AMLODIPINE BESYLATE 10 MG PO TABS: 10.0000 mg | ORAL_TABLET | Freq: Every day | ORAL | 0 refills | Status: AC

## 2024-06-29 MED ORDER — ROSUVASTATIN CALCIUM 5 MG PO TABS: 5.0000 mg | ORAL_TABLET | Freq: Every day | ORAL | 0 refills | Status: AC

## 2024-06-29 MED ORDER — LISINOPRIL 10 MG PO TABS: 10.0000 mg | ORAL_TABLET | Freq: Every day | ORAL | 0 refills | Status: AC

## 2024-06-29 NOTE — Discharge Instructions (Addendum)
 Your labs and other tests today are reassuring. Please add lisinopril  to your medication regimen and follow up with Dr. Sadie in 1 week.

## 2024-06-29 NOTE — ED Triage Notes (Signed)
 Pt arrives POV ambulatory to triage w/ no acute distress noted reporting intermittent dizziness, chest tightness, and arm tingling for months. Yesterday had those symptoms at work and checked his BP and is was 160/109. Pt takes amlodipine  but states it is not working, last dose yesterday morning. Woke this morning and BP was 170/113.. pt reports left arm tingling, denies cp at this time.

## 2024-06-29 NOTE — ED Notes (Signed)
 Blue top sent to lab.

## 2024-06-29 NOTE — ED Provider Notes (Signed)
 [facility] Provider Note    Event Date/Time   First MD Initiated Contact with Patient 06/29/24 0700     (approximate)   History   Chief Complaint: Hypertension   HPI  Tyler Oliver is a 48 y.o. male with a history of hypertension who comes ED complaining of intermittent lightheadedness.  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  No exertional symptoms.  Specifically denies chest pain or shortness of breath.  No falls or syncope.  Reports that his blood pressure is persistently about 160/110.  He does admit to some medication noncompliance, but generally does take his medication more often than not.  Feels like his amlodipine  10 mg is not controlling his blood pressure effectively and notes that it has been several years since has been on that dose and he has had about a 30 pound weight gain in the meantime.        Past Medical History:  Diagnosis Date   Hypertension    Pneumonia     Current Outpatient Rx   Order #: [id number] Class: Normal   Order #: [id number] Class: Normal   Order #: [id number] Class: Normal    No past surgical history on file.  Physical Exam   Triage Vital Signs: ED Triage Vitals [06/29/24 0647]  Encounter Vitals Group     BP (!) 153/112     Girls Systolic BP Percentile      Girls Diastolic BP Percentile      Boys Systolic BP Percentile      Boys Diastolic BP Percentile      Pulse Rate 93     Resp 18     Temp 97.8 F (36.6 C)     Temp Source Oral     SpO2 97 %     Weight 217 lb (98.4 kg)     Height 6' 1 (1.854 m)     Head Circumference      Peak Flow      Pain Score 7     Pain Loc      Pain Education      Exclude from Growth Chart     Most recent vital signs: Vitals:   06/29/24 0707 06/29/24 0715  BP:  (!) 161/102  Pulse:  75  Resp:  20  Temp:    SpO2: 100% 100%    General: Awake, no distress.  CV:  Good peripheral perfusion.  Regular rate rhythm, no  murmur Resp:  Normal effort.  Clear to auscultation bilaterally Abd:  No distention.  Soft nontender Other:  No lower extremity edema.  Normal distal pulses.   ED Results / Procedures / Treatments   Labs (all labs ordered are listed, but only abnormal results are displayed) Labs Reviewed  BASIC METABOLIC PANEL WITH GFR - Abnormal; Notable for the following components:      Result Value   Glucose, Bld 129 (*)    All other components within normal limits  CBC - Abnormal; Notable for the following components:   Hemoglobin 17.2 (*)    All other components within normal limits  TROPONIN I (HIGH SENSITIVITY)     EKG Interpreted by me Sinus rhythm rate of 89.  Normal axis intervals QRS ST segments T waves.  No acute ischemic changes. No prior EKG image available for comparison.  Description of previous EKG in 2023 is similar to today.   RADIOLOGY Chest x-ray interpreted by me, unremarkable.  Radiology report reviewed  PROCEDURES:  Procedures   MEDICATIONS ORDERED IN ED: Medications - No data to display   IMPRESSION / MDM / ASSESSMENT AND PLAN / ED COURSE  I reviewed the triage vital signs and the nursing notes.  DDx: Symptomatic hypertension, dehydration, electrolyte derangement, non-STEMI, anemia  Patient's presentation is most consistent with acute presentation with potential threat to life or bodily function.  Patient presents with intermittent episodes of lightheadedness, also reports some arm tingling.  Currently symptoms are resolved.  Doubt ACS PE dissection stroke intracranial hypertension glaucoma aneurysm.  Labs reassuring.  Counseled patient on importance of medication adherence daily.  Will add lisinopril  10 mg as well until he can see his primary care doctor.       FINAL CLINICAL IMPRESSION(S) / ED DIAGNOSES   Final diagnoses:  Hypertension, unspecified type  Dizziness     Rx / DC Orders   ED Discharge Orders          Ordered    amLODipine   (NORVASC ) 10 MG tablet  Daily        06/29/24 0734    rosuvastatin  (CRESTOR ) 5 MG tablet  Daily        06/29/24 0734    lisinopril  (ZESTRIL ) 10 MG tablet  Daily        06/29/24 0734             Note:  This document was prepared using Dragon voice recognition software and may include unintentional dictation errors.   Viviann Pastor, MD 06/29/24 [phone]

## 2024-07-20 ENCOUNTER — Encounter: Payer: Self-pay | Admitting: Emergency Medicine

## 2024-07-20 ENCOUNTER — Other Ambulatory Visit: Payer: Self-pay

## 2024-07-20 ENCOUNTER — Emergency Department
Admission: EM | Admit: 2024-07-20 | Discharge: 2024-07-20 | Disposition: A | Discharge status: Home / Self Care | Payer: Self-pay | Attending: Emergency Medicine | Admitting: Emergency Medicine

## 2024-07-20 DIAGNOSIS — Y990 Civilian activity done for income or pay: Secondary | ICD-10-CM | POA: Insufficient documentation

## 2024-07-20 DIAGNOSIS — X503XXA Overexertion from repetitive movements, initial encounter: Secondary | ICD-10-CM | POA: Insufficient documentation

## 2024-07-20 DIAGNOSIS — S39012A Strain of muscle, fascia and tendon of lower back, initial encounter: Secondary | ICD-10-CM | POA: Insufficient documentation

## 2024-07-20 MED ORDER — LIDOCAINE 5 % EX PTCH: 1.0000 | MEDICATED_PATCH | Freq: Two times a day (BID) | CUTANEOUS | 1 refills | Status: AC

## 2024-07-20 MED ORDER — LIDOCAINE 5 % EX PTCH
1.0000 | MEDICATED_PATCH | CUTANEOUS | Status: DC
  Administered 2024-07-20: 1 via TRANSDERMAL
  Filled 2024-07-20: qty 1

## 2024-07-20 MED ORDER — KETOROLAC TROMETHAMINE 30 MG/ML IJ SOLN
30.0000 mg | Freq: Once | INTRAMUSCULAR | Status: AC
  Administered 2024-07-20: 30 mg via INTRAMUSCULAR
  Filled 2024-07-20: qty 1

## 2024-07-20 MED ORDER — CYCLOBENZAPRINE HCL 5 MG PO TABS: 5.0000 mg | ORAL_TABLET | Freq: Three times a day (TID) | ORAL | 0 refills | Status: AC | PRN

## 2024-07-20 NOTE — Discharge Instructions (Signed)
 We believe that your symptoms are caused by musculoskeletal strain.  Please read through the included information about additional care such as heating pads, over-the-counter pain medicine (recommend you take acetaminophen  (Tylenol ) 1000 mg every 6 hours, as well as ibuprofen 800 mg 3 times a day with meals; try this combination for at least 5 to 7 days to see if it helps).  If you were provided a prescription please use it only as needed and as instructed.  Remember that early mobility and using the affected part of your body is actually better than keeping it immobile.  Follow-up with the doctor listed as recommended or return to the emergency department with new or worsening symptoms that concern you.

## 2024-07-20 NOTE — ED Provider Notes (Signed)
 [facility] Provider Note    Event Date/Time   First MD Initiated Contact with Patient 07/20/24 [phone]     (approximate)   History   Back Pain   HPI Tyler Oliver is a 48 y.o. male who presents for evaluation of gradually worsening bilateral lumbar pain over the last few weeks, perhaps even a month.  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  He said in the morning he feels stiff and sore, then he loosens up, but by the end of the workday he is always having a lot of aching pain in his lower back.  No radiation down his legs.  No numbness or weakness.  The pain makes it difficult at times to walk but he is not having any weakness.  No urinary incontinence or bowel changes.  No specific accident or injury.     Physical Exam   Triage Vital Signs: ED Triage Vitals  Encounter Vitals Group     BP 07/20/24 0416 (!) 175/110     Girls Systolic BP Percentile --      Girls Diastolic BP Percentile --      Boys Systolic BP Percentile --      Boys Diastolic BP Percentile --      Pulse Rate 07/20/24 0416 79     Resp 07/20/24 0416 20     Temp 07/20/24 0416 98.1 F (36.7 C)     Temp Source 07/20/24 0416 Oral     SpO2 07/20/24 0416 94 %     Weight --      Height --      Head Circumference --      Peak Flow --      Pain Score 07/20/24 0417 9     Pain Loc --      Pain Education --      Exclude from Growth Chart --     Most recent vital signs: Vitals:   07/20/24 0416  BP: (!) 175/110  Pulse: 79  Resp: 20  Temp: 98.1 F (36.7 C)  SpO2: 94%    General: Awake, no distress.  CV:  Good peripheral perfusion.  Resp:  Normal effort. Speaking easily and comfortably, no accessory muscle usage nor intercostal retractions.   Abd:  No distention.  Other:  Soft tissue paraspinal muscle tenderness in the lower lumbar region bilaterally.   No lumbar spine tenderness to palpation.  No limited range of motion, no difficulty with ambulation.   ED Results / Procedures / Treatments   Labs (all labs ordered are listed, but only abnormal results are displayed) Labs Reviewed - No data to display    PROCEDURES:  Critical Care performed: No  Procedures    IMPRESSION / MDM / ASSESSMENT AND PLAN / ED COURSE  I reviewed the triage vital signs and the nursing notes.                              Differential diagnosis includes, but is not limited to, lumbar strain, disc herniation, osteomyelitis, transverse myelitis, discitis, cauda equina syndrome.  Patient's presentation is most consistent with acute presentation with potential threat to life or bodily function.   Interventions/Medications given:  Medications  ketorolac  (TORADOL ) 30 MG/ML injection 30 mg (has no administration in time range)  lidocaine  (LIDODERM ) 5 % 1 patch (1 patch Transdermal Patch Applied 07/20/24 0547)    (Note:  hospital course my include additional interventions and/or labs/studies not listed above.)   Fortunately the patient has isolated soft tissue tenderness with no radiation and no neurological symptoms.  Patient also has no recent injury such as a fall and no sign of infection.  His blood pressure is elevated tonight but this is likely chronic, exacerbated by his current pain, asymptomatic, and he has a primary care doctor with whom he can follow-up.  No red flag warning signs about the back pain.  Medications given in the ED listed above and prescriptions listed below as well as recommendations for regular use of acetaminophen  and ibuprofen.  I gave him follow-up recommendations for Dr. Avanell and I gave my usual customary return precautions         FINAL CLINICAL IMPRESSION(S) / ED DIAGNOSES   Final diagnoses:  Lumbar strain, initial encounter     Rx / DC Orders   ED Discharge Orders          Ordered    lidocaine   (LIDODERM ) 5 %  Every 12 hours        07/20/24 0543    cyclobenzaprine  (FLEXERIL ) 5 MG tablet  3 times daily PRN        07/20/24 0543             Note:  This document was prepared using Dragon voice recognition software and may include unintentional dictation errors.   Gordan Huxley, MD 07/20/24 [phone]

## 2024-07-20 NOTE — ED Triage Notes (Signed)
 Pt arrives Pov, ambulatory to triage c/o lower back pain since yesterday. Otc meds not helping. Denies injury.states he does a lot of bending
